# Patient Record
Sex: Male | Born: 1970
Health system: Southern US, Community
[De-identification: ages and names within clinical notes are randomized; demographics above are authoritative.]

## PROBLEM LIST (undated history)

## (undated) DIAGNOSIS — T7840XA Allergy, unspecified, initial encounter: Secondary | ICD-10-CM

## (undated) HISTORY — DX: Allergy, unspecified, initial encounter: T78.40XA

## (undated) HISTORY — PX: NO PAST SURGERIES: SHX2092

---

## 2016-02-03 ENCOUNTER — Ambulatory Visit (INDEPENDENT_AMBULATORY_CARE_PROVIDER_SITE_OTHER): Payer: Self-pay | Admitting: Family Medicine

## 2016-02-03 ENCOUNTER — Telehealth: Payer: Self-pay | Admitting: Emergency Medicine

## 2016-02-03 ENCOUNTER — Ambulatory Visit (INDEPENDENT_AMBULATORY_CARE_PROVIDER_SITE_OTHER): Payer: Self-pay

## 2016-02-03 ENCOUNTER — Other Ambulatory Visit: Payer: Self-pay | Admitting: Emergency Medicine

## 2016-02-03 VITALS — BP 124/82 | HR 83 | Temp 98.0°F | Resp 15 | Ht 70.75 in | Wt 178.6 lb

## 2016-02-03 DIAGNOSIS — R0989 Other specified symptoms and signs involving the circulatory and respiratory systems: Secondary | ICD-10-CM

## 2016-02-03 DIAGNOSIS — J019 Acute sinusitis, unspecified: Secondary | ICD-10-CM

## 2016-02-03 DIAGNOSIS — R0789 Other chest pain: Secondary | ICD-10-CM

## 2016-02-03 DIAGNOSIS — F458 Other somatoform disorders: Secondary | ICD-10-CM

## 2016-02-03 LAB — POCT CBC
GRANULOCYTE PERCENT: 63.2 % (ref 37–80)
HEMATOCRIT: 42.7 % — AB (ref 43.5–53.7)
HEMOGLOBIN: 15.1 g/dL (ref 14.1–18.1)
LYMPH, POC: 1.3 (ref 0.6–3.4)
MCH, POC: 29.2 pg (ref 27–31.2)
MCHC: 35.3 g/dL (ref 31.8–35.4)
MCV: 82.7 fL (ref 80–97)
MID (cbc): 0.2 (ref 0–0.9)
MPV: 7 fL (ref 0–99.8)
POC GRANULOCYTE: 2.6 (ref 2–6.9)
POC LYMPH PERCENT: 32.3 %L (ref 10–50)
POC MID %: 4.5 % (ref 0–12)
Platelet Count, POC: 230 10*3/uL (ref 142–424)
RBC: 5.17 M/uL (ref 4.69–6.13)
RDW, POC: 14.9 %
WBC: 4.1 10*3/uL — AB (ref 4.6–10.2)

## 2016-02-03 LAB — POCT RAPID STREP A (OFFICE): Rapid Strep A Screen: NEGATIVE

## 2016-02-03 MED ORDER — IPRATROPIUM BROMIDE 0.03 % NA SOLN: 2.0000 | Freq: Two times a day (BID) | NASAL | Status: DC

## 2016-02-03 MED ORDER — AMOXICILLIN-POT CLAVULANATE 875-125 MG PO TABS: 1.0000 | ORAL_TABLET | Freq: Two times a day (BID) | ORAL | Status: AC

## 2016-02-03 MED ORDER — AMOXICILLIN-POT CLAVULANATE 875-125 MG PO TABS: 1.0000 | ORAL_TABLET | Freq: Two times a day (BID) | ORAL | Status: DC

## 2016-02-03 NOTE — Progress Notes (Signed)
[facility] [address], [place] [address] [phone]  Date:  02/03/2016   Name:  Ronald Richardson   DOB:  March 21, 1971   MRN:  [id number]  PCP:  No PCP Per Patient    Chief Complaint: Sore Throat and Depression screening   History of Present Illness:  This is a 45 y.o. male who is presenting with spitting up bloody mucus x 2 months. States it feels like there is something stuck in his throat. Every 30 minutes or less he feels he needs to spit up mucus. Not really coughing. He is having nasal congestion and frontal headache. No facial pain. Denies fever, chills. Is having some night sweats. Does not pay attention to his weight, not sure if he has lost or gained any.  No SOB or wheezing. States he has lost his sense of smell. He is sneezing and blowing out blood.  No revent travel From [place]. Has been in the [place] for 12 years. Has not been back to [place] in several years.  Aggravating/alleviating factors: Hasn't tried anything History of asthma: no History of env allergies: no Tobacco use: no Wife has been sick with her asthma, but otherwise is ok. Drives a fork lift for work. Does not have kids.  Review of Systems:  Review of Systems See HPI  There are no active problems to display for this patient.   Prior to Admission medications   Not on File    Not on File  History reviewed. No pertinent past surgical history.  Social History  Substance Use Topics  . Smoking status: Never Smoker   . Smokeless tobacco: None  . Alcohol Use: None    History reviewed. No pertinent family history.  Medication list has been reviewed and updated.  Physical Examination:  Physical Exam  Constitutional: He is oriented to person, place, and time. He appears well-developed and well-nourished. No distress.  HENT:  Head: Normocephalic and atraumatic.  Right Ear: Hearing, tympanic membrane, external ear and ear canal normal.  Left Ear: Hearing, tympanic membrane,  external ear and ear canal normal.  Nose: Nose normal. Right sinus exhibits no maxillary sinus tenderness and no frontal sinus tenderness. Left sinus exhibits no maxillary sinus tenderness and no frontal sinus tenderness.  Mouth/Throat: Uvula is midline and mucous membranes are normal. Posterior oropharyngeal erythema present. No oropharyngeal exudate or posterior oropharyngeal edema.  In room patient spits up moderate amount of yellow/brown mucus into a tissue  Eyes: Conjunctivae and lids are normal. Right eye exhibits no discharge. Left eye exhibits no discharge. No scleral icterus.  Neck: Trachea normal. Carotid bruit is not present. No thyromegaly present.  Cardiovascular: Normal rate, regular rhythm, normal heart sounds and normal pulses.   No murmur heard. Pulmonary/Chest: Effort normal and breath sounds normal. No respiratory distress. He has no wheezes. He has no rhonchi. He has no rales.  Musculoskeletal: Normal range of motion.  Lymphadenopathy:       Head (right side): No submental, no submandibular and no tonsillar adenopathy present.       Head (left side): No submental, no submandibular and no tonsillar adenopathy present.    He has no cervical adenopathy.  Neurological: He is alert and oriented to person, place, and time.  Skin: Skin is warm, dry and intact. No lesion and no rash noted.  Psychiatric: He has a normal mood and affect. His speech is normal and behavior is normal. Thought content normal.   BP 124/82 mmHg  Pulse  83  Temp(Src) 98 F (36.7 C) (Oral)  Resp 15  Ht 5' 10.75" (1.797 m)  Wt 178 lb 9.6 oz (81.012 kg)  BMI 25.09 kg/m2  SpO2 99%  Results for orders placed or performed in visit on 02/03/16  POCT CBC  Result Value Ref Range   WBC 4.1 (A) 4.6 - 10.2 K/uL   Lymph, poc 1.3 0.6 - 3.4   POC LYMPH PERCENT 32.3 10 - 50 %L   MID (cbc) 0.2 0 - 0.9   POC MID % 4.5 0 - 12 %M   POC Granulocyte 2.6 2 - 6.9   Granulocyte percent 63.2 37 - 80 %G   RBC 5.17 4.69  - 6.13 M/uL   Hemoglobin 15.1 14.1 - 18.1 g/dL   HCT, POC 16.142.7 (A) 09.643.5 - 53.7 %   MCV 82.7 80 - 97 fL   MCH, POC 29.2 27 - 31.2 pg   MCHC 35.3 31.8 - 35.4 g/dL   RDW, POC 04.514.9 %   Platelet Count, POC 230 142 - 424 K/uL   MPV 7.0 0 - 99.8 fL  POCT rapid strep A  Result Value Ref Range   Rapid Strep A Screen Negative Negative   EKG: mild sinus brady, no signs of ischemia  Dg Chest 2 View  02/03/2016  CLINICAL DATA:  Hemoptysis for the past 2 months EXAM: CHEST  2 VIEW COMPARISON:  None in PACs FINDINGS: The lungs are well-expanded. There is no focal infiltrate. There is no pleural effusion. No pulmonary parenchymal nodules or masses are observed. The heart and pulmonary vascularity are normal. The mediastinum is normal in width. The trachea is midline. The bony thorax is unremarkable. IMPRESSION: There is no evidence of pneumonia nor other acute cardiopulmonary abnormality. If the patient's hemoptysis persists and sinus disease has been excluded, chest CT scanning would be a useful next imaging step. Electronically Signed   By: David  SwazilandJordan M.D.   On: 02/03/2016 11:18     Assessment and Plan:  1. Chest tightness 2. Globus sensation 3. Acute sinusitis CBC, EKG, CXR all wnl. Rapid strep negative. Suspect possible sinusitis as cause of mucus production. Will treat with augmentin and nasal spray. If symptoms not improving after abx, return for further eval. - EKG 12-Lead - DG Chest 2 View; Future - POCT CBC - POCT rapid strep A - Culture, Group A Strep - amoxicillin-clavulanate (AUGMENTIN) 875-125 MG tablet; Take 1 tablet by mouth 2 (two) times daily.  Dispense: 20 tablet; Refill: 0 - ipratropium (ATROVENT) 0.03 % nasal spray; Place 2 sprays into both nostrils 2 (two) times daily.  Dispense: 30 mL; Refill: 0   Nicole V. Dyke BrackettBush, PA-C, MHS [facility]  02/03/2016

## 2016-02-03 NOTE — Telephone Encounter (Signed)
Prescription sent to the wrong pharmacy. [facility] [place]

## 2016-02-03 NOTE — Patient Instructions (Addendum)
Take antibiotic twice a day for 10 days Use nasal spray, two sprays in each nostril twice a day If you are not better after finishing the antibiotic, return to clinic for further evaluation    IF you received an x-ray today, you will receive an invoice from [facility]. Please contact [facility] at [phone] with questions or concerns regarding your invoice.   IF you received labwork today, you will receive an invoice from [facility]. Please contact [facility] at [phone] with questions or concerns regarding your invoice.   Our billing staff will not be able to assist you with questions regarding bills from these companies.  You will be contacted with the lab results as soon as they are available. The fastest way to get your results is to activate your My Chart account. Instructions are located on the last page of this paperwork. If you have not heard from us regarding the results in 2 weeks, please contact this office.

## 2016-02-05 LAB — CULTURE, GROUP A STREP: Organism ID, Bacteria: NORMAL

## 2016-03-14 NOTE — Progress Notes (Signed)
History and physical examinations reviewed in detail with PA Bush.  EKG reviewed during visit; CXR reviewed as well.  Agree with assessment and plan. Kristi Paulita FujitaMartin Smith, M.D. [facility]  [place] [address] [place], [place]  [id number] [phone] phone [phone] fax

## 2017-07-31 ENCOUNTER — Ambulatory Visit: Payer: Self-pay | Attending: Nurse Practitioner | Admitting: Nurse Practitioner

## 2017-07-31 ENCOUNTER — Encounter: Payer: Self-pay | Admitting: Nurse Practitioner

## 2017-07-31 VITALS — BP 112/69 | HR 85 | Temp 98.4°F | Resp 18 | Ht 70.0 in | Wt 186.6 lb

## 2017-07-31 DIAGNOSIS — J309 Allergic rhinitis, unspecified: Secondary | ICD-10-CM | POA: Insufficient documentation

## 2017-07-31 DIAGNOSIS — Z0000 Encounter for general adult medical examination without abnormal findings: Secondary | ICD-10-CM

## 2017-07-31 DIAGNOSIS — J3089 Other allergic rhinitis: Secondary | ICD-10-CM | POA: Insufficient documentation

## 2017-07-31 MED ORDER — LORATADINE 10 MG PO TABS: 10.0000 mg | ORAL_TABLET | Freq: Every day | ORAL | 11 refills | Status: DC

## 2017-07-31 MED ORDER — IPRATROPIUM BROMIDE 0.03 % NA SOLN: 2.0000 | Freq: Two times a day (BID) | NASAL | 2 refills | Status: DC

## 2017-07-31 MED ORDER — FLUTICASONE PROPIONATE 50 MCG/ACT NA SUSP: 1.0000 | Freq: Every day | NASAL | 6 refills | Status: DC

## 2017-07-31 MED FILL — FLUTICASONE PROP 50 MCG SPR: 50 | 30 days supply | Qty: 16 | Fill #0

## 2017-07-31 MED FILL — IPRATROPIUM 0.03% SPRAY: 0.03 | 15 days supply | Qty: 30 | Fill #0

## 2017-07-31 NOTE — Progress Notes (Signed)
Assessment & Plan:  Ronald LewandowskySeydou was seen today for new patient (initial visit).  Diagnoses and all orders for this visit:  Non-seasonal allergic rhinitis due to other allergic trigger -     loratadine (CLARITIN) 10 MG tablet; Take 1 tablet (10 mg total) daily by mouth. -     ipratropium (ATROVENT) 0.03 % nasal spray; Place 2 sprays 2 (two) times daily into both nostrils.  Routine adult health maintenance -     CBC -     Lipid panel -     Basic Metabolic Panel     Subjective:   Chief Complaint  Patient presents with  . New Patient (Initial Visit)    Patient would like to get nasal spray for his nose. Patient stated that his is stuffy and heavy.    HPI Ronald Richardson 46 y.o. male presents to office today with concerns of burning, itching and redness of eyes along with nasal stuffiness. He denies ever being diagnosed with allergies. He denies any injury to eyes or any visual disturbances.   Allergic Rhinitis: Ronald Richardson Richardson is here for evaluation of possible allergic rhinitis. Patient's symptoms include itchy eyes, itchy nose, nasal congestion, postnasal drip, sinus pressure and watery eyes. These symptoms are perennial. Current triggers include exposure to no known precipitant. The patient has been suffering from these symptoms for approximately several  years. The patient has tried nothing with no relief of symptoms. Immunotherapy has never been tried. The patient has never had nasal polyps. The patient has no history of asthma. The patient does not suffer from frequent sinopulmonary infections. The patient has not had sinus surgery in the past. The patient has no history of eczema.   History reviewed. No pertinent past medical history.  History reviewed. No pertinent surgical history.  History reviewed. No pertinent family history.  Social History   Socioeconomic History  . Marital status: Married    Spouse name: Not on file  . Number of children: Not on file  . Years of education:  Not on file  . Highest education level: Not on file  Social Needs  . Financial resource strain: Not on file  . Food insecurity - worry: Not on file  . Food insecurity - inability: Not on file  . Transportation needs - medical: Not on file  . Transportation needs - non-medical: Not on file  Occupational History  . Not on file  Tobacco Use  . Smoking status: Never Smoker  . Smokeless tobacco: Never Used  Substance and Sexual Activity  . Alcohol use: No    Alcohol/week: 0.0 oz    Frequency: Never  . Drug use: No  . Sexual activity: Yes  Other Topics Concern  . Not on file  Social History Narrative  . Not on file     Review of Systems  Constitutional: Negative for fever, malaise/fatigue and weight loss.  HENT: Positive for congestion. Negative for ear discharge, ear pain, hearing loss, nosebleeds, sinus pain, sore throat and tinnitus.   Eyes: Positive for pain and redness. Negative for blurred vision, double vision and photophobia.       Itchy watery eyes  Respiratory: Negative.  Negative for cough, shortness of breath and stridor.   Cardiovascular: Negative.  Negative for chest pain, palpitations and leg swelling.  Gastrointestinal: Positive for heartburn. Negative for abdominal pain, constipation, diarrhea, nausea and vomiting.  Musculoskeletal: Negative.  Negative for myalgias.  Neurological: Negative.  Negative for dizziness, focal weakness, seizures and headaches.  Endo/Heme/Allergies: Negative for  environmental allergies.  Psychiatric/Behavioral: Negative.  Negative for suicidal ideas.       Objective:    Physical Exam  Constitutional: He is oriented to person, place, and time. He appears well-developed and well-nourished. He is cooperative.  HENT:  Head: Normocephalic and atraumatic.  Right Ear: Hearing, tympanic membrane, external ear and ear canal normal.  Left Ear: Hearing, tympanic membrane, external ear and ear canal normal.  Nose: Mucosal edema and rhinorrhea  present.  Mouth/Throat: Posterior oropharyngeal erythema present.  Eyes: EOM are normal.  Neck: Normal range of motion.  Cardiovascular: Normal rate, regular rhythm, normal heart sounds and intact distal pulses. Exam reveals no gallop and no friction rub.  No murmur heard. Pulmonary/Chest: Effort normal and breath sounds normal. No tachypnea. No respiratory distress. He has no decreased breath sounds. He has no wheezes. He has no rhonchi. He has no rales. He exhibits no tenderness.  Abdominal: Soft. Bowel sounds are normal.  Musculoskeletal: Normal range of motion. He exhibits no edema.  Neurological: He is alert and oriented to person, place, and time. Coordination normal.  Skin: Skin is warm and dry.  Psychiatric: He has a normal mood and affect. His behavior is normal. Judgment and thought content normal.  Nursing note and vitals reviewed.   BP 112/69 (BP Location: Left Arm, Patient Position: Sitting, Cuff Size: Normal)   Pulse 85   Temp 98.4 F (36.9 C) (Oral)   Resp 18   Ht 5\' 10"  (1.778 m)   Wt 186 lb 9.6 oz (84.6 kg)   SpO2 97%   BMI 26.77 kg/m  Wt Readings from Last 3 Encounters:  07/31/17 186 lb 9.6 oz (84.6 kg)  02/03/16 178 lb 9.6 oz (81 kg)   Patient has been counseled on age-appropriate routine health concerns for screening and prevention. These are reviewed and up-to-date. Referrals have been placed accordingly. Immunizations are up-to-date or declined.        Patient has been counseled extensively about nutrition and exercise as well as the importance of adherence with medications and regular follow-up. The patient was given clear instructions to go to ER or return to medical center if symptoms don't improve, worsen or new problems develop. The patient verbalized understanding.  Patient has been counseled on age-appropriate routine health concerns for screening and prevention. These are reviewed and up-to-date. Referrals have been placed accordingly. Immunizations are  up-to-date or declined.      Follow-up: Return in about 6 weeks (around 09/11/2017) for Follow up with me; Needs appointment with financial representative.Claiborne Rigg.   Zelda W Fleming, FNP-BC [facility] [place], [place] [phone]   07/31/2017, 12:34 PM

## 2017-07-31 NOTE — Patient Instructions (Addendum)
Allergies An allergy is when your body reacts to a substance in a way that is not normal. An allergic reaction can happen after you:  Eat something.  Breathe in something.  Touch something.  You can be allergic to:  Things that are only around during certain seasons, like molds and pollens.  Foods.  Drugs.  Insects.  Animal dander.  What are the signs or symptoms?  Puffiness (swelling). This may happen on the lips, face, tongue, mouth, or throat.  Sneezing.  Coughing.  Breathing loudly (wheezing).  Stuffy nose.  Tingling in the mouth.  A rash.  Itching.  Itchy, red, puffy areas of skin (hives).  Watery eyes.  Throwing up (vomiting).  Watery poop (diarrhea).  Dizziness.  Feeling faint or fainting.  Trouble breathing or swallowing.  A tight feeling in the chest.  A fast heartbeat. How is this diagnosed? Allergies can be diagnosed with:  A medical and family history.  Skin tests.  Blood tests.  A food diary. A food diary is a record of all the foods, drinks, and symptoms you have each day.  The results of an elimination diet. This diet involves making sure not to eat certain foods and then seeing what happens when you start eating them again.  How is this treated? There is no cure for allergies, but allergic reactions can be treated with medicine. Severe reactions usually need to be treated at a hospital. How is this prevented? The best way to prevent an allergic reaction is to avoid the thing you are allergic to. Allergy shots and medicines can also help prevent reactions in some cases. This information is not intended to replace advice given to you by your health care provider. Make sure you discuss any questions you have with your health care provider. Document Released: 12/30/2012 Document Revised: 05/01/2016 Document Reviewed: 06/16/2014 Elsevier Interactive Patient Education  2018 [employer].  Nasal Allergies Nasal allergies are a  reaction to allergens in the air. Allergens are tiny specks (particles) in the air that cause your body to have an allergic reaction. Nasal allergies are not passed from person to person (contagious). They cannot be cured, but they can be controlled. Common causes of nasal allergies include:  Pollen from grasses, trees, and weeds.  House dust mites.  Pet dander.  Mold.  Follow these instructions at home:  Avoid the allergen that is causing your symptoms, if you can.  Keep windows closed. If possible, use air conditioning when there is a lot of pollen in the air.  Do not use fans in your home.  Do not hang clothes outside to dry.  Wear sunglasses to keep pollen out of your eyes.  Wash your hands right away after you touch household pets.  Take over-the-counter and prescription medicines only as told by your doctor.  Keep all follow-up visits as told by your doctor. This is important. Contact a doctor if:  You have a fever.  You have a cough that does not go away (is persistent).  You start to make whistling sounds when you breathe (wheeze).  Your symptoms do not get better with treatment.  You have thick fluid coming from your nose.  You start to have nosebleeds. Get help right away if:  Your tongue or your lips are swollen.  You have trouble breathing.  You feel light-headed or you feel like you are going to pass out (faint).  You have cold sweats. This information is not intended to replace advice given to  you by your health care provider. Make sure you discuss any questions you have with your health care provider. Document Released: 01/04/2011 Document Revised: 02/10/2016 Document Reviewed: 03/17/2015 Elsevier Interactive Patient Education  [employer]2018 Elsevier Inc.

## 2017-08-01 LAB — LIPID PANEL
Chol/HDL Ratio: 4.1 ratio (ref 0.0–5.0)
Cholesterol, Total: 165 mg/dL (ref 100–199)
HDL: 40 mg/dL (ref >39)
LDL Calculated: 111 mg/dL — ABNORMAL HIGH (ref 0–99)
Triglycerides: 69 mg/dL (ref 0–149)
VLDL Cholesterol Cal: 14 mg/dL (ref 5–40)

## 2017-08-01 LAB — CBC
HEMOGLOBIN: 13.5 g/dL (ref 13.0–17.7)
Hematocrit: 39.1 % (ref 37.5–51.0)
MCH: 27.6 pg (ref 26.6–33.0)
MCHC: 34.5 g/dL (ref 31.5–35.7)
MCV: 80 fL (ref 79–97)
PLATELETS: 251 10*3/uL (ref 150–379)
RBC: 4.9 x10E6/uL (ref 4.14–5.80)
RDW: 15.3 % (ref 12.3–15.4)
WBC: 5 10*3/uL (ref 3.4–10.8)

## 2017-08-01 LAB — BASIC METABOLIC PANEL
BUN / CREAT RATIO: 16 (ref 9–20)
BUN: 18 mg/dL (ref 6–24)
CALCIUM: 9.4 mg/dL (ref 8.7–10.2)
CO2: 22 mmol/L (ref 20–29)
CREATININE: 1.1 mg/dL (ref 0.76–1.27)
Chloride: 102 mmol/L (ref 96–106)
GFR calc Af Amer: 93 mL/min/{1.73_m2} (ref >59)
GFR calc non Af Amer: 80 mL/min/{1.73_m2} (ref >59)
GLUCOSE: 102 mg/dL — AB (ref 65–99)
Potassium: 4.3 mmol/L (ref 3.5–5.2)
Sodium: 142 mmol/L (ref 134–144)

## 2017-08-02 ENCOUNTER — Telehealth: Payer: Self-pay

## 2017-08-02 NOTE — Telephone Encounter (Signed)
-----   Message from Zelda W Fleming, NP sent at 08/02/2017 10:08 AM EST ----- Please inform patient that laboratory results are essentially normal. LDL is elevated in regards to lipids. We will continue to monitor for any trending on future lab results. Continue healthy eating habit and regular physical exercise at least 3 times a week, 50 minutes each time. 

## 2017-08-02 NOTE — Telephone Encounter (Signed)
-----   Message from Claiborne RiggZelda W Fleming, NP sent at 08/02/2017 10:08 AM EST ----- Please inform patient that laboratory results are essentially normal. LDL is elevated in regards to lipids. We will continue to monitor for any trending on future lab results. Continue healthy eating habit and regular physical exercise at least 3 times a week, 50 minutes each time.

## 2017-08-02 NOTE — Telephone Encounter (Signed)
Pt. Returned nurse call regarding lab results. Please f/u with pt.

## 2017-08-02 NOTE — Telephone Encounter (Signed)
Patient did not pick up. Left a message for patient to call back.

## 2017-08-02 NOTE — Telephone Encounter (Signed)
Patient did not answer. Left a message for patient to call back.

## 2017-08-03 NOTE — Telephone Encounter (Signed)
-----   Message from Zelda W Fleming, NP sent at 08/02/2017 10:08 AM EST ----- Please inform patient that laboratory results are essentially normal. LDL is elevated in regards to lipids. We will continue to monitor for any trending on future lab results. Continue healthy eating habit and regular physical exercise at least 3 times a week, 50 minutes each time. 

## 2017-08-03 NOTE — Telephone Encounter (Signed)
Patient informed on lab result and to eat healthy diet, exercise a well.  Patient verified DOB.

## 2017-09-12 ENCOUNTER — Ambulatory Visit: Payer: Self-pay | Admitting: Nurse Practitioner

## 2017-12-07 IMAGING — CR DG CHEST 2V
2 series · 2 of 2 positions shown · non-contrast
Comparison: None in PACs

CLINICAL DATA: Hemoptysis for the past 2 months

EXAM:
CHEST  2 VIEW

[PA]
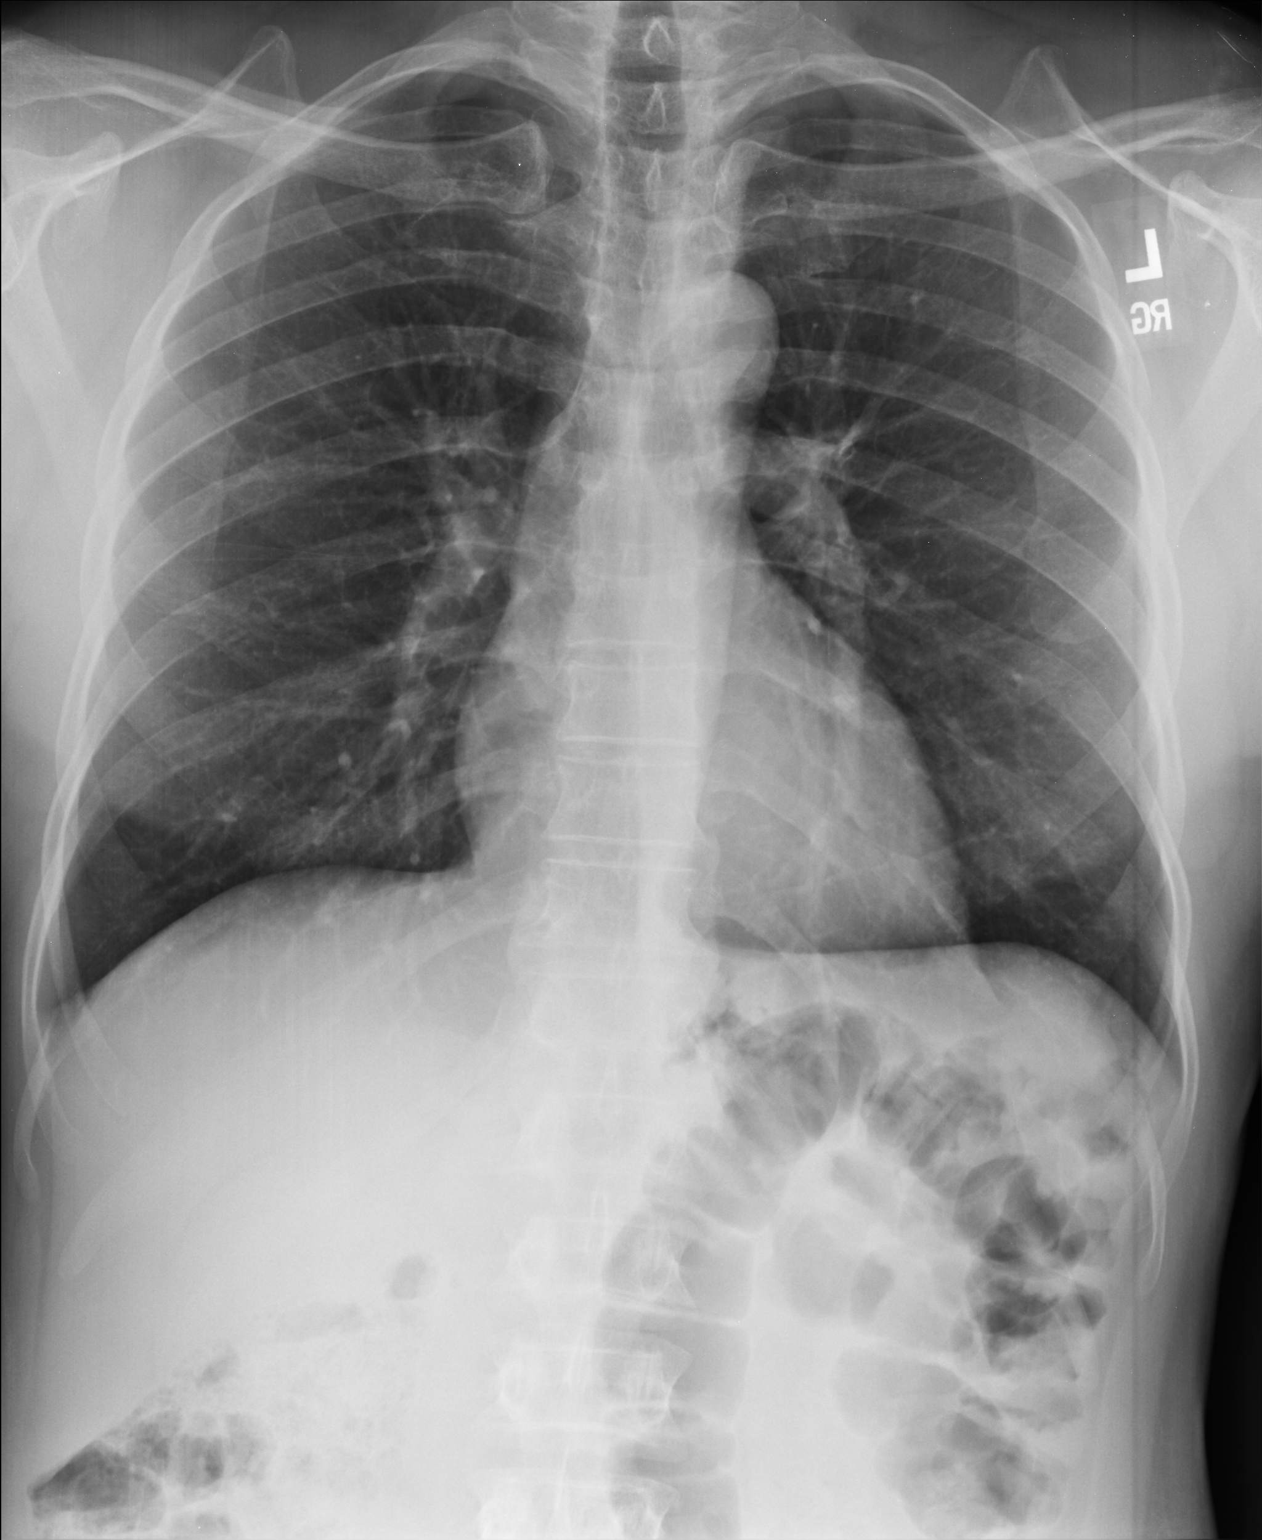

[lateral]
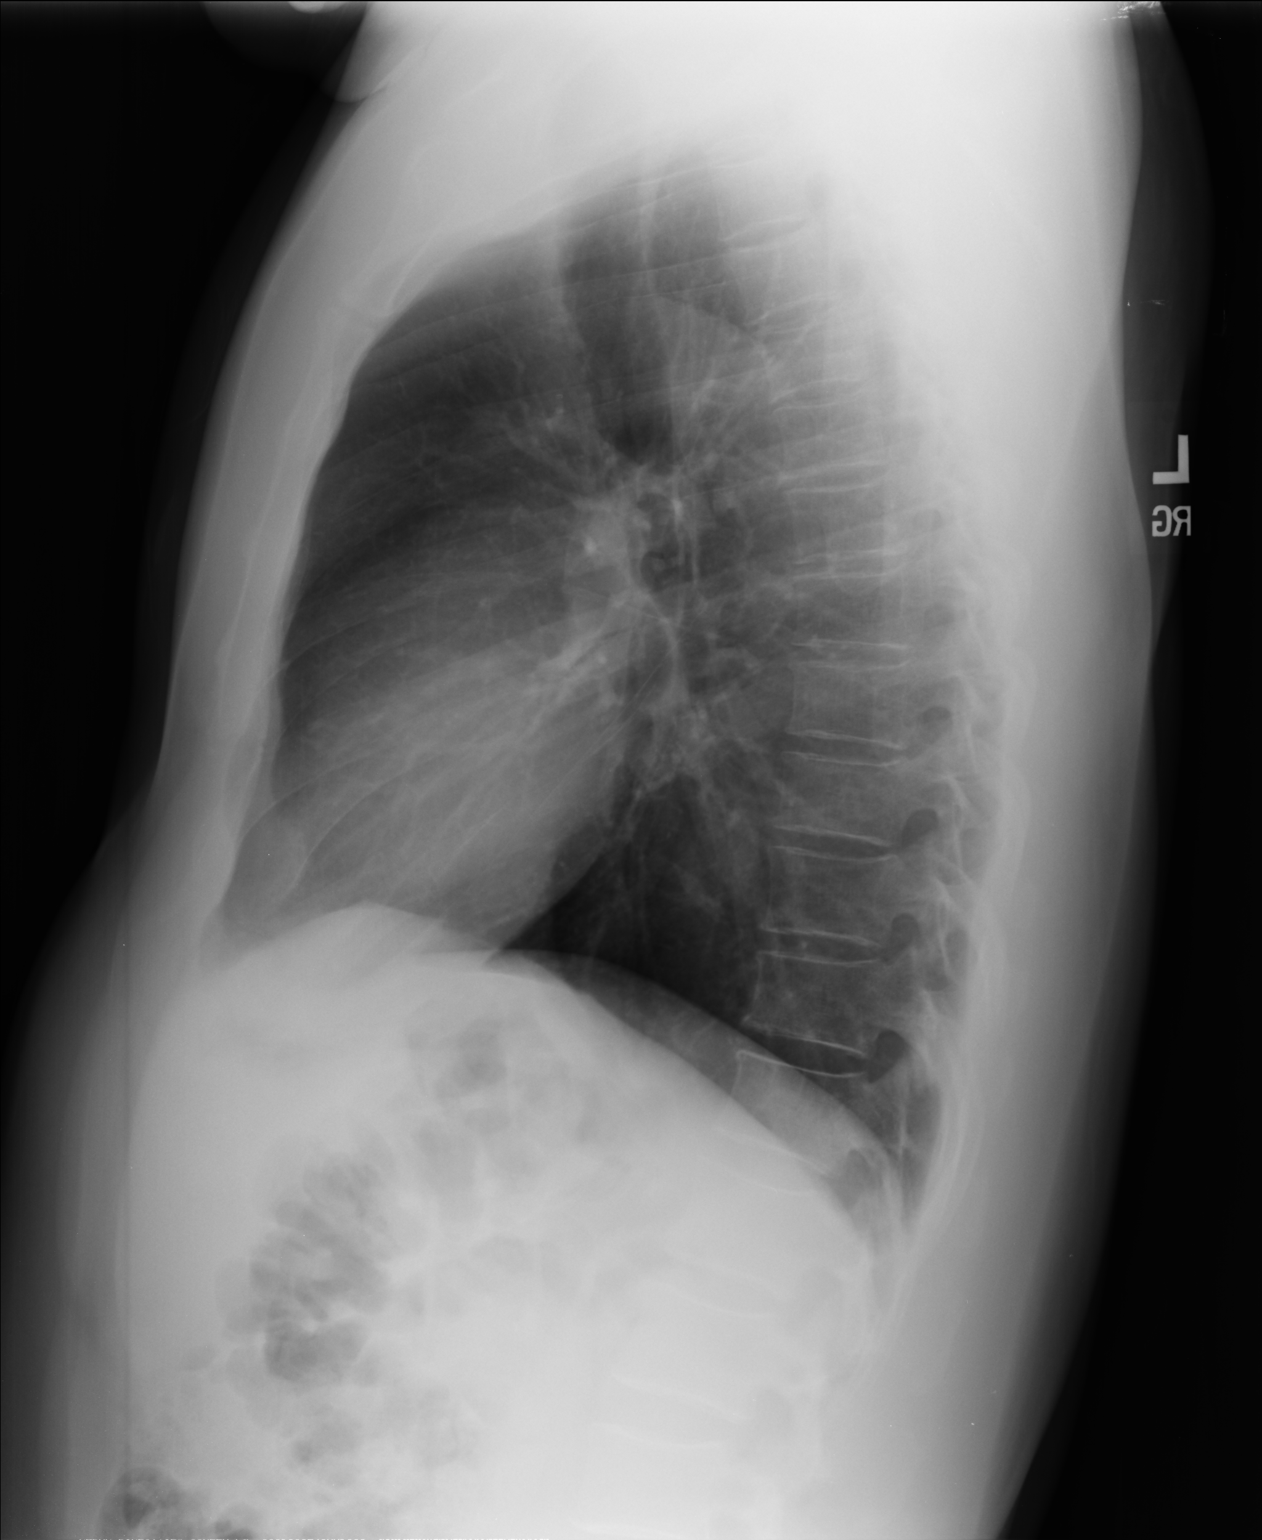

[2 of 2 positions shown; findings below may reference images not displayed]

FINDINGS: The lungs are well-expanded. There is no focal infiltrate. There is
no pleural effusion. No pulmonary parenchymal nodules or masses are
observed. The heart and pulmonary vascularity are normal. The
mediastinum is normal in width. The trachea is midline. The bony
thorax is unremarkable.
IMPRESSION: There is no evidence of pneumonia nor other acute cardiopulmonary
abnormality. If the patient's hemoptysis persists and sinus disease
has been excluded, chest CT scanning would be a useful next imaging
step.

## 2018-02-07 ENCOUNTER — Encounter: Payer: Self-pay | Admitting: Family Medicine

## 2019-11-10 ENCOUNTER — Ambulatory Visit: Payer: Self-pay | Attending: Nurse Practitioner | Admitting: Nurse Practitioner

## 2019-11-10 ENCOUNTER — Other Ambulatory Visit: Payer: Self-pay

## 2019-11-10 ENCOUNTER — Encounter: Payer: Self-pay | Admitting: Nurse Practitioner

## 2019-11-10 VITALS — Ht 71.0 in | Wt 199.0 lb

## 2019-11-10 DIAGNOSIS — Z114 Encounter for screening for human immunodeficiency virus [HIV]: Secondary | ICD-10-CM

## 2019-11-10 DIAGNOSIS — D72819 Decreased white blood cell count, unspecified: Secondary | ICD-10-CM

## 2019-11-10 DIAGNOSIS — J3089 Other allergic rhinitis: Secondary | ICD-10-CM

## 2019-11-10 DIAGNOSIS — Z13228 Encounter for screening for other metabolic disorders: Secondary | ICD-10-CM

## 2019-11-10 DIAGNOSIS — R7309 Other abnormal glucose: Secondary | ICD-10-CM

## 2019-11-10 DIAGNOSIS — E785 Hyperlipidemia, unspecified: Secondary | ICD-10-CM

## 2019-11-10 DIAGNOSIS — Z7689 Persons encountering health services in other specified circumstances: Secondary | ICD-10-CM

## 2019-11-10 MED ORDER — IPRATROPIUM BROMIDE 0.03 % NA SOLN: 2.0000 | Freq: Two times a day (BID) | NASAL | 2 refills | Status: DC

## 2019-11-10 MED FILL — IPRATROPIUM 0.03% SPRAY: 0.03 | 30 days supply | Qty: 30 | Fill #0

## 2019-11-10 NOTE — Progress Notes (Signed)
Virtual Visit via Telephone Note Due to national recommendations of social distancing due to COVID 19, telehealth visit is felt to be most appropriate for this patient at this time.  I discussed the limitations, risks, security and privacy concerns of performing an evaluation and management service by telephone and the availability of in person appointments. I also discussed with the patient that there may be a patient responsible charge related to this service. The patient expressed understanding and agreed to proceed.    I connected with Ronald Richardson on 11/10/19  at   8:50 AM EST  EDT by telephone and verified that I am speaking with the correct person using two identifiers.   Consent I discussed the limitations, risks, security and privacy concerns of performing an evaluation and management service by telephone and the availability of in person appointments. I also discussed with the patient that there may be a patient responsible charge related to this service. The patient expressed understanding and agreed to proceed.   Location of Patient: Private  Residence   Location of Provider: [facility]    Persons participating in Telemedicine visit: Zelda Fleming FNP-BC YY Bien CMA Bijan Ridolfi    History of Present Illness: Telemedicine visit for: Re establish Care  has a past medical history of Allergy.    Allergy symptoms include nasal congestion, rhinitis. He experiences headaches with flonase and requesting atrovent today.    Past Medical History:  Diagnosis Date  . Allergy     Past Surgical History:  Procedure Laterality Date  . NO PAST SURGERIES      Family History  Problem Relation Age of Onset  . Hypertension Neg Hx   . Diabetes Neg Hx     Social History   Socioeconomic History  . Marital status: Married    Spouse name: Not on file  . Number of children: Not on file  . Years of education: Not on file  . Highest education level: Not  on file  Occupational History  . Not on file  Tobacco Use  . Smoking status: Never Smoker  . Smokeless tobacco: Never Used  Substance and Sexual Activity  . Alcohol use: No    Alcohol/week: 0.0 standard drinks  . Drug use: No  . Sexual activity: Yes  Other Topics Concern  . Not on file  Social History Narrative  . Not on file   Social Determinants of Health   Financial Resource Strain:   . Difficulty of Paying Living Expenses: Not on file  Food Insecurity:   . Worried About Running Out of Food in the Last Year: Not on file  . Ran Out of Food in the Last Year: Not on file  Transportation Needs:   . Lack of Transportation (Medical): Not on file  . Lack of Transportation (Non-Medical): Not on file  Physical Activity:   . Days of Exercise per Week: Not on file  . Minutes of Exercise per Session: Not on file  Stress:   . Feeling of Stress : Not on file  Social Connections:   . Frequency of Communication with Friends and Family: Not on file  . Frequency of Social Gatherings with Friends and Family: Not on file  . Attends Religious Services: Not on file  . Active Member of Clubs or Organizations: Not on file  . Attends Club or Organization Meetings: Not on file  . Marital Status: Not on file     Observations/Objective: Awake, alert and oriented x 3     Review of Systems  Constitutional: Negative for fever, malaise/fatigue and weight loss.  HENT: Negative for congestion, ear discharge, ear pain, hearing loss, nosebleeds, sinus pain, sore throat and tinnitus.        Nasal congestion  Eyes: Negative.  Negative for blurred vision, double vision and photophobia.  Respiratory: Negative.  Negative for cough, shortness of breath and stridor.   Cardiovascular: Negative.  Negative for chest pain, palpitations and leg swelling.  Gastrointestinal: Negative.  Negative for heartburn, nausea and vomiting.  Musculoskeletal: Negative.  Negative for myalgias.  Neurological: Negative.   Negative for dizziness, focal weakness, seizures and headaches.  Endo/Heme/Allergies: Positive for environmental allergies.  Psychiatric/Behavioral: Negative.  Negative for suicidal ideas.    Assessment and Plan: Ronald Richardson was seen today for establish care.  Diagnoses and all orders for this visit:  Encounter to establish care  Non-seasonal allergic rhinitis due to other allergic trigger -     ipratropium (ATROVENT) 0.03 % nasal spray; Place 2 sprays into both nostrils 2 (two) times daily.  Dyslipidemia -     Lipid panel; Future  Elevated glucose -     Hemoglobin A1c; Future -     CMP14+EGFR; Future  Screening for metabolic disorder -     CMP14+EGFR; Future  Leukopenia, unspecified type -     CBC; Future  Encounter for screening for HIV -     HIV antibody (with reflex); Future     Follow Up Instructions No follow-ups on file.     I discussed the assessment and treatment plan with the patient. The patient was provided an opportunity to ask questions and all were answered. The patient agreed with the plan and demonstrated an understanding of the instructions.   The patient was advised to call back or seek an in-person evaluation if the symptoms worsen or if the condition fails to improve as anticipated.  I provided 14 minutes of non-face-to-face time during this encounter including median intraservice time, reviewing previous notes, labs, imaging, medications and explaining diagnosis and management.  Zelda W Fleming, FNP-BC 

## 2019-11-12 ENCOUNTER — Ambulatory Visit: Payer: PRIVATE HEALTH INSURANCE | Attending: Nurse Practitioner

## 2019-11-12 ENCOUNTER — Other Ambulatory Visit: Payer: Self-pay

## 2019-11-12 DIAGNOSIS — E785 Hyperlipidemia, unspecified: Secondary | ICD-10-CM

## 2019-11-12 DIAGNOSIS — R7309 Other abnormal glucose: Secondary | ICD-10-CM

## 2019-11-12 DIAGNOSIS — D72819 Decreased white blood cell count, unspecified: Secondary | ICD-10-CM

## 2019-11-12 DIAGNOSIS — Z114 Encounter for screening for human immunodeficiency virus [HIV]: Secondary | ICD-10-CM

## 2019-11-12 DIAGNOSIS — Z13228 Encounter for screening for other metabolic disorders: Secondary | ICD-10-CM

## 2019-11-13 LAB — LIPID PANEL
Chol/HDL Ratio: 3.9 ratio (ref 0.0–5.0)
Cholesterol, Total: 146 mg/dL (ref 100–199)
HDL: 37 mg/dL — ABNORMAL LOW (ref >39)
LDL Chol Calc (NIH): 99 mg/dL (ref 0–99)
Triglycerides: 44 mg/dL (ref 0–149)
VLDL Cholesterol Cal: 10 mg/dL (ref 5–40)

## 2019-11-13 LAB — CMP14+EGFR
ALT: 17 IU/L (ref 0–44)
AST: 22 IU/L (ref 0–40)
Albumin/Globulin Ratio: 1.8 (ref 1.2–2.2)
Albumin: 4.4 g/dL (ref 4.0–5.0)
Alkaline Phosphatase: 65 IU/L (ref 39–117)
BUN/Creatinine Ratio: 15 (ref 9–20)
BUN: 16 mg/dL (ref 6–24)
Bilirubin Total: 0.5 mg/dL (ref 0.0–1.2)
CO2: 23 mmol/L (ref 20–29)
Calcium: 9.4 mg/dL (ref 8.7–10.2)
Chloride: 103 mmol/L (ref 96–106)
Creatinine, Ser: 1.1 mg/dL (ref 0.76–1.27)
GFR calc Af Amer: 91 mL/min/{1.73_m2} (ref >59)
GFR calc non Af Amer: 79 mL/min/{1.73_m2} (ref >59)
Globulin, Total: 2.5 g/dL (ref 1.5–4.5)
Glucose: 94 mg/dL (ref 65–99)
Potassium: 4.5 mmol/L (ref 3.5–5.2)
Sodium: 141 mmol/L (ref 134–144)
Total Protein: 6.9 g/dL (ref 6.0–8.5)

## 2019-11-13 LAB — CBC
Hematocrit: 42.7 % (ref 37.5–51.0)
Hemoglobin: 14.4 g/dL (ref 13.0–17.7)
MCH: 28.5 pg (ref 26.6–33.0)
MCHC: 33.7 g/dL (ref 31.5–35.7)
MCV: 84 fL (ref 79–97)
Platelets: 260 10*3/uL (ref 150–450)
RBC: 5.06 x10E6/uL (ref 4.14–5.80)
RDW: 13.9 % (ref 11.6–15.4)
WBC: 3.9 10*3/uL (ref 3.4–10.8)

## 2019-11-13 LAB — HEMOGLOBIN A1C
Est. average glucose Bld gHb Est-mCnc: 120 mg/dL
Hgb A1c MFr Bld: 5.8 % — ABNORMAL HIGH (ref 4.8–5.6)

## 2019-11-13 LAB — HIV ANTIBODY (ROUTINE TESTING W REFLEX): HIV Screen 4th Generation wRfx: NONREACTIVE

## 2020-01-30 ENCOUNTER — Ambulatory Visit: Payer: PRIVATE HEALTH INSURANCE | Admitting: Nurse Practitioner

## 2020-02-09 ENCOUNTER — Telehealth: Payer: Self-pay

## 2020-02-11 NOTE — Telephone Encounter (Signed)
Confirmed meds for refill.

## 2020-03-10 ENCOUNTER — Other Ambulatory Visit: Payer: Self-pay | Admitting: Nurse Practitioner

## 2020-03-10 ENCOUNTER — Other Ambulatory Visit: Payer: Self-pay

## 2020-03-10 ENCOUNTER — Ambulatory Visit: Payer: PRIVATE HEALTH INSURANCE | Attending: Nurse Practitioner | Admitting: Nurse Practitioner

## 2020-03-10 DIAGNOSIS — R7303 Prediabetes: Secondary | ICD-10-CM

## 2020-03-10 DIAGNOSIS — J3089 Other allergic rhinitis: Secondary | ICD-10-CM

## 2020-03-10 MED ORDER — AZELASTINE HCL 0.1 % NA SOLN: 2.0000 | Freq: Two times a day (BID) | NASAL | 12 refills | Status: DC

## 2020-03-10 MED ORDER — LORATADINE 10 MG PO TABS: 10.0000 mg | ORAL_TABLET | Freq: Every day | ORAL | 11 refills | Status: DC

## 2020-03-10 MED FILL — AZELASTINE HCL 137 MCG SPRY: 0.1 | 30 days supply | Qty: 30 | Fill #0

## 2020-03-15 ENCOUNTER — Ambulatory Visit: Payer: PRIVATE HEALTH INSURANCE | Attending: Nurse Practitioner

## 2020-03-15 ENCOUNTER — Other Ambulatory Visit: Payer: Self-pay

## 2020-03-15 DIAGNOSIS — R7303 Prediabetes: Secondary | ICD-10-CM

## 2020-03-16 LAB — CMP14+EGFR
ALT: 13 IU/L (ref 0–44)
AST: 20 IU/L (ref 0–40)
Albumin/Globulin Ratio: 2.1 (ref 1.2–2.2)
Albumin: 4.6 g/dL (ref 4.0–5.0)
Alkaline Phosphatase: 69 IU/L (ref 48–121)
BUN/Creatinine Ratio: 17 (ref 9–20)
BUN: 17 mg/dL (ref 6–24)
Bilirubin Total: 0.4 mg/dL (ref 0.0–1.2)
CO2: 25 mmol/L (ref 20–29)
Calcium: 9.8 mg/dL (ref 8.7–10.2)
Chloride: 103 mmol/L (ref 96–106)
Creatinine, Ser: 1 mg/dL (ref 0.76–1.27)
GFR calc Af Amer: 102 mL/min/{1.73_m2} (ref >59)
GFR calc non Af Amer: 88 mL/min/{1.73_m2} (ref >59)
Globulin, Total: 2.2 g/dL (ref 1.5–4.5)
Glucose: 83 mg/dL (ref 65–99)
Potassium: 4.5 mmol/L (ref 3.5–5.2)
Sodium: 140 mmol/L (ref 134–144)
Total Protein: 6.8 g/dL (ref 6.0–8.5)

## 2020-03-16 LAB — HEMOGLOBIN A1C
Est. average glucose Bld gHb Est-mCnc: 111 mg/dL
Hgb A1c MFr Bld: 5.5 % (ref 4.8–5.6)

## 2020-03-18 ENCOUNTER — Telehealth: Payer: Self-pay | Admitting: *Deleted

## 2020-03-18 NOTE — Telephone Encounter (Signed)
Chart opened in error

## 2020-03-18 NOTE — Telephone Encounter (Addendum)
Pt returned call and was given the lab result message from [facility] regarding his diabetes has improved.

## 2020-03-30 ENCOUNTER — Other Ambulatory Visit: Payer: Self-pay | Admitting: Nurse Practitioner

## 2020-03-30 MED ORDER — TERBINAFINE HCL 250 MG PO TABS: 250.0000 mg | ORAL_TABLET | Freq: Every day | ORAL | 0 refills | Status: DC

## 2020-03-31 MED FILL — TERBINAFINE HCL 250 MG TABS: 250 | 42 days supply | Qty: 42 | Fill #0

## 2020-05-28 ENCOUNTER — Telehealth: Payer: Self-pay | Admitting: Nurse Practitioner

## 2020-05-28 NOTE — Telephone Encounter (Signed)
Copied from CRM [id number]. Topic: General - Other >> May 28, 2020  4:25 PM Tamela Oddi wrote: Reason for CRM: Patient called to get a stronger sinus medication.  Patient stated that this medication is not helping him.  Please advise and call patient to get something stronger.  CB# [phone]

## 2020-05-30 NOTE — Telephone Encounter (Signed)
Needs office visit. Can try sudafed over the counter

## 2020-05-31 NOTE — Telephone Encounter (Signed)
Spoke to patient and attempt to schedule to see one of the provider at [facility]. No availability, CMA sent patient to the Mobile Bus location for Wednesday 06/02/2020. Pt. Is aware the bus open from 9-7pm.

## 2020-06-22 ENCOUNTER — Telehealth: Payer: PRIVATE HEALTH INSURANCE | Admitting: Nurse Practitioner

## 2020-08-02 ENCOUNTER — Telehealth: Payer: PRIVATE HEALTH INSURANCE | Admitting: Nurse Practitioner

## 2020-09-18 ENCOUNTER — Emergency Department (HOSPITAL_COMMUNITY)
Admission: EM | Admit: 2020-09-18 | Discharge: 2020-09-18 | Disposition: A | Discharge status: Home / Self Care | Payer: PRIVATE HEALTH INSURANCE | Attending: Emergency Medicine | Admitting: Emergency Medicine

## 2020-09-18 ENCOUNTER — Other Ambulatory Visit (HOSPITAL_COMMUNITY): Payer: Self-pay | Admitting: Emergency Medicine

## 2020-09-18 ENCOUNTER — Encounter (HOSPITAL_COMMUNITY): Payer: Self-pay | Admitting: Emergency Medicine

## 2020-09-18 DIAGNOSIS — R059 Cough, unspecified: Secondary | ICD-10-CM | POA: Diagnosis present

## 2020-09-18 DIAGNOSIS — J3089 Other allergic rhinitis: Secondary | ICD-10-CM

## 2020-09-18 DIAGNOSIS — U071 COVID-19: Secondary | ICD-10-CM | POA: Diagnosis not present

## 2020-09-18 LAB — RESP PANEL BY RT-PCR (FLU A&B, COVID) ARPGX2
Influenza A by PCR: NEGATIVE
Influenza B by PCR: NEGATIVE
SARS Coronavirus 2 by RT PCR: POSITIVE — AB

## 2020-09-18 MED ORDER — IPRATROPIUM BROMIDE 0.03 % NA SOLN: 2.0000 | Freq: Two times a day (BID) | NASAL | 0 refills | Status: DC

## 2020-09-18 MED ORDER — ACETAMINOPHEN 500 MG PO TABS
500.0000 mg | ORAL_TABLET | Freq: Four times a day (QID) | ORAL | 0 refills | Status: DC | PRN
Start: 2020-09-18 — End: 2021-07-26

## 2020-09-18 NOTE — Discharge Instructions (Addendum)
You have been tested positive for COVID-19.  Please follow instruction provided below.  I also represcribed your nasal spray for your sinus congestion.  Recommendations for at home COVID-19 symptoms management:  Please continue isolation at home. Call [phone] to see whether you might be eligible for therapeutic antibody infusions (leave your name and they will call you back).  If have acute worsening of symptoms please go to ER/urgent care for further evaluation. Check pulse oximetry and if below 90-92% please go to ER. The following supplements MAY help:  Vitamin C 500mg  twice a day and Quercetin 250-500 mg twice a day Vitamin D3 2000 - 4000 u/day B Complex vitamins Zinc 75-100 mg/day Melatonin 6-10 mg at night (the optimal dose is unknown) Aspirin 81mg /day (if no history of bleeding issues)

## 2020-09-18 NOTE — ED Provider Notes (Signed)
[facility] EMERGENCY DEPARTMENT Provider Note   CSN: [id number] Arrival date & time: 09/18/20  1204     History Chief Complaint  Patient presents with  . Cough    Ronald Richardson is a 50 y.o. male.  The history is provided by the patient and medical records. No language interpreter was used.  Cough    50 year old male who is non-smoker with no history of alcohol abuse presenting with complaints of cold symptoms.  Patient reports since yesterday she has had sinus congestion, throat irritation, nonproductive cough, and some headaches.  Does not complain of fever chills no loss of taste or smell no shortness of breath and no abdominal pain.  He has been fully vaccinated for COVID-19.  He denies any recent sick contact.  He did try some nasal spray without adequate relief.  Past Medical History:  Diagnosis Date  . Allergy     Patient Active Problem List   Diagnosis Date Noted  . Allergic rhinitis due to allergen 07/31/2017    Past Surgical History:  Procedure Laterality Date  . NO PAST SURGERIES         Family History  Problem Relation Age of Onset  . Hypertension Neg Hx   . Diabetes Neg Hx     Social History   Tobacco Use  . Smoking status: Never Smoker  . Smokeless tobacco: Never Used  Vaping Use  . Vaping Use: Never used  Substance Use Topics  . Alcohol use: No    Alcohol/week: 0.0 standard drinks  . Drug use: No    Home Medications Prior to Admission medications   Medication Sig Start Date End Date Taking? Authorizing Provider  azelastine (ASTELIN) 0.1 % nasal spray Place 2 sprays into both nostrils 2 (two) times daily. Use in each nostril as directed 03/10/20   Claiborne Rigg, NP  ipratropium (ATROVENT) 0.03 % nasal spray Place 2 sprays into both nostrils 2 (two) times daily. 11/10/19 12/10/19  Claiborne Rigg, NP  loratadine (CLARITIN) 10 MG tablet Take 1 tablet (10 mg total) by mouth daily. 03/10/20   Claiborne Rigg, NP  terbinafine  (LAMISIL) 250 MG tablet Take 1 tablet (250 mg total) by mouth daily. 03/30/20   Claiborne Rigg, NP    Allergies    Flonase [fluticasone propionate]  Review of Systems   Review of Systems  Respiratory: Positive for cough.   All other systems reviewed and are negative.   Physical Exam Updated Vital Signs BP 127/88   Pulse (!) 112   Temp 98.8 F (37.1 C) (Oral)   Resp 18   Ht 5\' 11"  (1.803 m)   Wt 86.2 kg   SpO2 100%   BMI 26.50 kg/m   Physical Exam Vitals and nursing note reviewed.  Constitutional:      General: He is not in acute distress.    Appearance: He is well-developed and well-nourished.  HENT:     Head: Atraumatic.  Eyes:     Conjunctiva/sclera: Conjunctivae normal.  Cardiovascular:     Rate and Rhythm: Normal rate and regular rhythm.     Pulses: Normal pulses.     Heart sounds: Normal heart sounds.  Pulmonary:     Effort: Pulmonary effort is normal.     Breath sounds: Normal breath sounds. No wheezing, rhonchi or rales.  Abdominal:     Palpations: Abdomen is soft.     Tenderness: There is no abdominal tenderness.  Musculoskeletal:     Cervical back:  Neck supple.  Skin:    Findings: No rash.  Neurological:     Mental Status: He is alert.  Psychiatric:        Mood and Affect: Mood and affect and mood normal.     ED Results / Procedures / Treatments   Labs (all labs ordered are listed, but only abnormal results are displayed) Labs Reviewed  RESP PANEL BY RT-PCR (FLU A&B, COVID) ARPGX2 - Abnormal; Notable for the following components:      Result Value   SARS Coronavirus 2 by RT PCR POSITIVE (*)    All other components within normal limits    EKG None  Radiology No results found.  Procedures Procedures (including critical care time)  Medications Ordered in ED Medications - No data to display  ED Course  I have reviewed the triage vital signs and the nursing notes.  Pertinent labs & imaging results that were available during my care  of the patient were reviewed by me and considered in my medical decision making (see chart for details).    MDM Rules/Calculators/A&P                          BP 127/88   Pulse (!) 112   Temp 98.8 F (37.1 C) (Oral)   Resp 18   Ht 5\' 11"  (1.803 m)   Wt 86.2 kg   SpO2 100%   BMI 26.50 kg/m   Final Clinical Impression(s) / ED Diagnoses Final diagnoses:  COVID-19 virus infection    Rx / DC Orders ED Discharge Orders         Ordered    ipratropium (ATROVENT) 0.03 % nasal spray  2 times daily        09/18/20 1556    acetaminophen (TYLENOL) 500 MG tablet  Every 6 hours PRN        09/18/20 1556         3:57 PM Patient developed cold symptoms for the past 2 days.  He has been fully vaccinated for COVID-19.  His Covid test unfortunately is positive.  He is well-appearing no hypoxia and is stable for discharge.  Will provide symptomatic treatment.  He also has history of sinus allergies and requesting for nasal spray.  Ipratropium bromide nasal spray prescribed.  Return precaution discussed  Ronald Richardson was evaluated in Emergency Department on 09/18/2020 for the symptoms described in the history of present illness. He was evaluated in the context of the global COVID-19 pandemic, which necessitated consideration that the patient might be at risk for infection with the SARS-CoV-2 virus that causes COVID-19. Institutional protocols and algorithms that pertain to the evaluation of patients at risk for COVID-19 are in a state of rapid change based on information released by regulatory bodies including the CDC and federal and state organizations. These policies and algorithms were followed during the patient's care in the ED.    11/16/2020, PA-C 09/18/20 1602    11/16/20, MD 09/19/20 2046

## 2020-09-18 NOTE — ED Triage Notes (Signed)
Pt reports cough since yesterday and now has sore throat. Pt worried about a sinus infection. Denies any medical hx.

## 2020-09-18 NOTE — ED Notes (Signed)
Pt moved to COVID + waiting area.

## 2020-09-20 MED FILL — IPRATROPIUM 0.03% SPRAY: 0.03 | 30 days supply | Qty: 30 | Fill #0

## 2020-10-06 ENCOUNTER — Telehealth: Payer: Self-pay | Admitting: Nurse Practitioner

## 2020-10-06 NOTE — Telephone Encounter (Signed)
Pt returning call. He is requesting to have a call back. Please advise.

## 2020-10-06 NOTE — Telephone Encounter (Signed)
Pt is calling and went to [facility] on 09-18-2020 and was dx with covid and was given medication to get OTC and pt would like to know if zelda could call in OTC med so that he can use his flex spending card. Pt would like rxs for vit c 500 mg and vit d3 2000 mg. Pt said they did not written down how many pills to take per day.  Please sent to [facility]

## 2020-10-06 NOTE — Telephone Encounter (Signed)
Encouraged patient to f/u with pharmacy to see if the medication is there:  Vitamin C 500mg  twice a day and Quercetin 250-500 mg twice a day Vitamin D3 2000 - 4000 u/day B Complex vitamins Zinc 75-100 mg/day Melatonin 6-10 mg at night (the optimal dose is unknown) Aspirin 81mg /day (if no history of bleeding issues)

## 2021-02-10 ENCOUNTER — Other Ambulatory Visit: Payer: Self-pay

## 2021-02-10 ENCOUNTER — Ambulatory Visit: Payer: PRIVATE HEALTH INSURANCE | Attending: Physician Assistant | Admitting: Physician Assistant

## 2021-02-10 DIAGNOSIS — J3089 Other allergic rhinitis: Secondary | ICD-10-CM | POA: Diagnosis not present

## 2021-02-10 MED ORDER — AZELASTINE HCL 0.1 % NA SOLN
2.0000 | Freq: Two times a day (BID) | NASAL | 12 refills | Status: DC
  Filled 2021-02-10: qty 30, 30d supply, fill #0

## 2021-02-10 NOTE — Progress Notes (Signed)
Virtual Visit via Telephone Note  I connected with Ronald Richardson on 02/10/21 at 10:50 AM EDT by telephone and verified that I am speaking with the correct person using two identifiers.  Location: Patient: home Provider: [facility] office   I discussed the limitations, risks, security and privacy concerns of performing an evaluation and management service by telephone and the availability of in person appointments. I also discussed with the patient that there may be a patient responsible charge related to this service. The patient expressed understanding and agreed to proceed.   History of Present Illness: Needs nasal spray RF.  No new issues or concerns.     Observations/Objective: NAD.  A&Ox3  Assessment and Plan: 1. Non-seasonal allergic rhinitis due to other allergic trigger - azelastine (ASTELIN) 0.1 % nasal spray; Place 2 sprays into both nostrils 2 (two) times daily. Use in each nostril as directed  Dispense: 30 mL; Refill: 12    Follow Up Instructions: See PCP for bloodwork/check up in 3 months   I discussed the assessment and treatment plan with the patient. The patient was provided an opportunity to ask questions and all were answered. The patient agreed with the plan and demonstrated an understanding of the instructions.   The patient was advised to call back or seek an in-person evaluation if the symptoms worsen or if the condition fails to improve as anticipated.  I provided 6 minutes of non-face-to-face time during this encounter.   Georgian Co, PA-C  Patient ID: Ronald Richardson, male   DOB: [birth year], 50 y.o.   MRN: [id number]

## 2021-02-11 ENCOUNTER — Other Ambulatory Visit: Payer: Self-pay

## 2021-03-01 ENCOUNTER — Telehealth: Payer: Self-pay | Admitting: Nurse Practitioner

## 2021-03-01 NOTE — Telephone Encounter (Signed)
Called pt to schedule 3 Month F.U. w/ PCP, NP Bertram Denver for August.  LVM to call [facility] to schedule this appt.

## 2021-03-01 NOTE — Telephone Encounter (Signed)
-----   Message from Royce Macadamia, RN sent at 02/11/2021  5:29 PM EDT ----- Please se below message and schedule appt. Thank you

## 2021-07-14 ENCOUNTER — Encounter: Payer: Self-pay | Admitting: Physician Assistant

## 2021-07-14 ENCOUNTER — Ambulatory Visit: Payer: PRIVATE HEALTH INSURANCE | Attending: Physician Assistant | Admitting: Physician Assistant

## 2021-07-14 ENCOUNTER — Other Ambulatory Visit: Payer: Self-pay

## 2021-07-14 VITALS — BP 128/89 | HR 84 | Ht 71.0 in | Wt 186.1 lb

## 2021-07-14 DIAGNOSIS — J3089 Other allergic rhinitis: Secondary | ICD-10-CM

## 2021-07-14 DIAGNOSIS — B070 Plantar wart: Secondary | ICD-10-CM

## 2021-07-14 MED ORDER — IPRATROPIUM BROMIDE 0.03 % NA SOLN
NASAL | 3 refills | Status: DC
  Filled 2021-07-14: qty 30, 30d supply, fill #0

## 2021-07-14 NOTE — Patient Instructions (Signed)
Plantar Warts ?Plantar warts are small growths on the bottom of the foot (sole). Warts are caused by a type of germ (virus). Most warts are not painful, and they usually do not cause problems. Sometimes, plantar warts can cause pain when you walk. ?Warts often go away on their own in time. They can also spread to other areas of the body. Treatments may be done if needed. ?What are the causes? ?Plantar warts are caused by a germ that is called human papillomavirus (HPV). ?Walking barefoot can cause exposure to the germ, especially if your feet are wet. ?Warts happen when HPV attacks a break in the skin of the foot. ?What increases the risk? ?Being between 10 and 20 years of age. ?Using public showers or locker rooms. ?Having a weakened body defense system (immune system). ?What are the signs or symptoms? ? ?Flat or slightly raised growths that have a rough surface and look like a callus. ?Pain when you use your foot to support your body weight. ?How is this treated? ?In many cases, warts do not need treatment. Without treatment, they often go away with time. If treatment is needed or wanted, options may include: ?Applying medicated solutions, creams, or patches to the wart. These make the skin soft so that layers will slowly shed away. ?Freezing the wart with liquid nitrogen (cryotherapy). ?Burning the wart with: ?Laser treatment. ?An electrified probe (electrocautery). ?Injecting a medicine (Candida antigen) into the wart to help the body's defense system fight off the wart. ?Having surgery to remove the wart. ?Putting duct tape over the top of the wart (occlusion). You will leave the tape in place for as long as told by your doctor. Then you will replace it with a new strip of tape. This is done until the wart goes away. ?Repeat treatment may be needed if you choose to remove warts. Warts sometimes go away and come back again. ?Follow these instructions at home: ?General instructions ?Apply creams or solutions only  as told by your doctor. Follow these steps if your doctor tells you to do so: ?Soak your foot in warm water. ?Remove the top layer of softened skin before you apply the medicine. You can use a pumice stone to remove the skin. ?After you apply the medicine, put a bandage over the area of the wart. ?Repeat the process every day or as told by your doctor. ?Do not scratch or pick at a wart. ?Wash your hands after you touch a wart. ?If a wart hurts, try covering it with a bandage that has a hole in the middle. ?Keep all follow-up visits as told by your doctor. This is important. ?How is this prevented? ? ?Wear shoes and socks. Change your socks every day. ?Keep your feet clean and dry. ?Check your feet often. ?Do not walk barefoot in: ?Shared locker rooms. ?Shower areas. ?Swimming pools. ?Avoid direct contact with warts on other people. ?Contact a doctor if: ?Your warts do not improve after treatment. ?You have redness, swelling, or pain at the site of a wart. ?You have bleeding from a wart, and the bleeding does not stop when you put light pressure on the wart. ?You have diabetes and you get a wart. ?Summary ?Warts are small growths on the skin. ?When warts happen on the bottom of the foot (sole), they are called plantar warts. ?In many cases, warts do not need treatment. ?Apply creams or solutions only as told by your doctor. ?Do not scratch or pick at a wart. Wash your hands   after you touch a wart. ?This information is not intended to replace advice given to you by your health care provider. Make sure you discuss any questions you have with your health care provider. ?Document Revised: 12/23/2020 Document Reviewed: 12/23/2020 ?Elsevier Patient Education ? 2022 Elsevier Inc. ? ?

## 2021-07-14 NOTE — Progress Notes (Signed)
Ronald Richardson, is a 50 y.o. male  WCB:[id number]  OHY:[id number]  DOB - 1971-05-06  Chief Complaint  Patient presents with   Foot Pain   Arm Pain    Left arm pain x 3 months       Subjective:   Ronald Richardson is a 50 y.o. male here today for tender area on medial aspect of R foot.  It has been there for several months and getting bigger.    ALLERGIES: Allergies  Allergen Reactions   Flonase [Fluticasone Propionate] Other (See Comments)    HEADACHES    PAST MEDICAL HISTORY: Past Medical History:  Diagnosis Date   Allergy     MEDICATIONS AT HOME: Prior to Admission medications   Medication Sig Start Date End Date Taking? Authorizing Provider  acetaminophen (TYLENOL) 500 MG tablet Take 1 tablet (500 mg total) by mouth every 6 (six) hours as needed. Patient not taking: Reported on 07/14/2021 09/18/20   Fayrene Helper, PA-C  ipratropium (ATROVENT) 0.03 % nasal spray PLACE 2 SPRAYS INTO BOTH NOSTRILS 2 (TWO) TIMES DAILY. 07/14/21 07/14/22  Anders Simmonds, PA-C  loratadine (CLARITIN) 10 MG tablet Take 1 tablet (10 mg total) by mouth daily. Patient not taking: Reported on 07/14/2021 03/10/20   Claiborne Rigg, NP  terbinafine (LAMISIL) 250 MG tablet Take 1 tablet (250 mg total) by mouth daily. Patient not taking: Reported on 07/14/2021 03/30/20   Claiborne Rigg, NP    ROS:   Objective:   Vitals:   07/14/21 1511  BP: 128/89  Pulse: 84  SpO2: 96%  Weight: 186 lb 2 oz (84.4 kg)  Height: 5\' 11"  (1.803 m)   Exam General appearance : Awake, alert, not in any distress. Speech Clear. Not toxic looking HEENT: Atraumatic and Normocephalic Neck: Supple, no JVD. No cervical lymphadenopathy.  Chest: Good air entry bilaterally, CTAB.  No rales/rhonchi/wheezing CVS: S1 S2 regular, no murmurs.  R foot medial aspect of foot at the junction of the plantar and dorsal area, there is a plantars wart Extremities: B/L Lower Ext shows no edema, both legs are warm to touch Neurology: Awake  alert, and oriented X 3, CN II-XII intact, Non focal Skin: No Rash  Data Review Lab Results  Component Value Date   HGBA1C 5.5 03/15/2020   HGBA1C 5.8 (H) 11/12/2019    Assessment & Plan   1. Plantar wart of right foot - Ambulatory referral to Podiatry  2. Non-seasonal allergic rhinitis due to other allergic trigger - ipratropium (ATROVENT) 0.03 % nasal spray; PLACE 2 SPRAYS INTO BOTH NOSTRILS 2 (TWO) TIMES DAILY.  Dispense: 30 mL; Refill: 3    Patient have been counseled extensively about nutrition and exercise. Other issues discussed during this visit include: low cholesterol diet, weight control and daily exercise, foot care, annual eye examinations at Ophthalmology, importance of adherence with medications and regular follow-up. We also discussed long term complications of uncontrolled diabetes and hypertension.   Return if symptoms worsen or fail to improve.  The patient was given clear instructions to go to ER or return to medical center if symptoms don't improve, worsen or new problems develop. The patient verbalized understanding. The patient was told to call to get lab results if they haven't heard anything in the next week.      11/14/2019, PA-C [facility] [place], [place]   07/14/2021, 4:06 PM Patient ID: Ronald Richardson, male   DOB: November 13, 1970, 50 y.o.   MRN: [id number]

## 2021-07-18 ENCOUNTER — Other Ambulatory Visit: Payer: Self-pay

## 2021-07-25 ENCOUNTER — Ambulatory Visit: Payer: Self-pay

## 2021-07-25 NOTE — Telephone Encounter (Signed)
Please schedule him for televisit for 07-26-2021. Thanks!

## 2021-07-25 NOTE — Telephone Encounter (Signed)
  Pt. Reports he started having headaches 1 month ago. Hurts on the left side. Has had some "runny nose" recently. Has not taken anything for pain. Instructed him that Tylenol is on his medication list. Verbalizes understanding. Pain 3-4/10. Wants to know from PCP what else he should do. Please advise.   Answer Assessment - Initial Assessment Questions 1. LOCATION: "Where does it hurt?"      Left side 2. ONSET: "When did the headache start?" (Minutes, hours or days)      1 month ago 3. PATTERN: "Does the pain come and go, or has it been constant since it started?"     Comes and goes 4. SEVERITY: "How bad is the pain?" and "What does it keep you from doing?"  (e.g., Scale 1-10; mild, moderate, or severe)   - MILD (1-3): doesn't interfere with normal activities    - MODERATE (4-7): interferes with normal activities or awakens from sleep    - SEVERE (8-10): excruciating pain, unable to do any normal activities        Sometimes - 3-4 5. RECURRENT SYMPTOM: "Have you ever had headaches before?" If Yes, ask: "When was the last time?" and "What happened that time?"      Yes 6. CAUSE: "What do you think is causing the headache?"     Unsure 7. MIGRAINE: "Have you been diagnosed with migraine headaches?" If Yes, ask: "Is this headache similar?"      No 8. HEAD INJURY: "Has there been any recent injury to the head?"      No 9. OTHER SYMPTOMS: "Do you have any other symptoms?" (fever, stiff neck, eye pain, sore throat, cold symptoms)     No 10. PREGNANCY: "Is there any chance you are pregnant?" "When was your last menstrual period?"       N/a  Protocols used: Headache-A-AH

## 2021-07-25 NOTE — Telephone Encounter (Signed)
Please schedule him for televisit for today/Tuesday 07-26-2021. Thanks!

## 2021-07-26 ENCOUNTER — Other Ambulatory Visit: Payer: Self-pay

## 2021-07-26 ENCOUNTER — Encounter: Payer: Self-pay | Admitting: Nurse Practitioner

## 2021-07-26 ENCOUNTER — Ambulatory Visit: Payer: PRIVATE HEALTH INSURANCE | Attending: Nurse Practitioner | Admitting: Nurse Practitioner

## 2021-07-26 DIAGNOSIS — Z1211 Encounter for screening for malignant neoplasm of colon: Secondary | ICD-10-CM

## 2021-07-26 DIAGNOSIS — R7989 Other specified abnormal findings of blood chemistry: Secondary | ICD-10-CM

## 2021-07-26 DIAGNOSIS — R7303 Prediabetes: Secondary | ICD-10-CM | POA: Diagnosis not present

## 2021-07-26 DIAGNOSIS — G44229 Chronic tension-type headache, not intractable: Secondary | ICD-10-CM

## 2021-07-26 DIAGNOSIS — J3089 Other allergic rhinitis: Secondary | ICD-10-CM

## 2021-07-26 MED ORDER — LORATADINE 10 MG PO TABS
10.0000 mg | ORAL_TABLET | Freq: Every day | ORAL | 11 refills | Status: AC
  Filled 2021-07-26: qty 30, 30d supply, fill #0

## 2021-07-26 MED ORDER — SUMATRIPTAN SUCCINATE 50 MG PO TABS
50.0000 mg | ORAL_TABLET | Freq: Once | ORAL | 1 refills | Status: AC
  Filled 2021-07-26: qty 9, 30d supply, fill #0

## 2021-07-26 MED ORDER — IPRATROPIUM BROMIDE 0.03 % NA SOLN
NASAL | 3 refills | Status: AC
  Filled 2021-07-26: qty 30, fill #0

## 2021-07-26 NOTE — Telephone Encounter (Signed)
Done. Left pt VM of scheduled appt.

## 2021-07-26 NOTE — Progress Notes (Signed)
Virtual Visit via Telephone Note Due to national recommendations of social distancing due to COVID 19, telehealth visit is felt to be most appropriate for this patient at this time.  I discussed the limitations, risks, security and privacy concerns of performing an evaluation and management service by telephone and the availability of in person appointments. I also discussed with the patient that there may be a patient responsible charge related to this service. The patient expressed understanding and agreed to proceed.    I connected with Ronald Richardson on 07/26/21  at   2:50 PM EST  EDT by telephone and verified that I am speaking with the correct person using two identifiers.  Location of Patient: Private Residence   Location of Provider: [facility]    Persons participating in Telemedicine visit: Zelda Fleming FNP-BC Lamount Tesfaye    History of Present Illness: Telemedicine visit for: Headaches  Onset over a month ago. Can last up to 2 days and resolve on their own. Headaches are occurring on the left side of the temple. He has not taken any medications for his headaches. Pain 8/10. Denies any weakness, numbness or tingling in arm or leg or facial numbness/droop.     Past Medical History:  Diagnosis Date   Allergy     Past Surgical History:  Procedure Laterality Date   NO PAST SURGERIES      Family History  Problem Relation Age of Onset   Hypertension Neg Hx    Diabetes Neg Hx     Social History   Socioeconomic History   Marital status: Married    Spouse name: Not on file   Number of children: Not on file   Years of education: Not on file   Highest education level: Not on file  Occupational History   Not on file  Tobacco Use   Smoking status: Never   Smokeless tobacco: Never  Vaping Use   Vaping Use: Never used  Substance and Sexual Activity   Alcohol use: No    Alcohol/week: 0.0 standard drinks   Drug use: No   Sexual activity:  Yes  Other Topics Concern   Not on file  Social History Narrative   Not on file   Social Determinants of Health   Financial Resource Strain: Not on file  Food Insecurity: Not on file  Transportation Needs: Not on file  Physical Activity: Not on file  Stress: Not on file  Social Connections: Not on file     Observations/Objective: Awake, alert and oriented x 3   Review of Systems  Constitutional:  Negative for fever, malaise/fatigue and weight loss.  HENT: Negative.  Negative for nosebleeds.   Eyes: Negative.  Negative for blurred vision, double vision and photophobia.  Respiratory: Negative.  Negative for cough and shortness of breath.   Cardiovascular: Negative.  Negative for chest pain, palpitations and leg swelling.  Gastrointestinal: Negative.  Negative for heartburn, nausea and vomiting.  Musculoskeletal: Negative.  Negative for myalgias.  Neurological:  Positive for headaches. Negative for dizziness, focal weakness and seizures.  Endo/Heme/Allergies:  Positive for environmental allergies.  Psychiatric/Behavioral: Negative.  Negative for suicidal ideas.    Assessment and Plan: Diagnoses and all orders for this visit:  Chronic tension-type headache, not intractable -     SUMAtriptan (IMITREX) 50 MG tablet; Take 1 tablet (50 mg total) by mouth once for 1 dose. May repeat in 2 hours if headache persists or recurs. -     Hemoglobin A1c; Future -       CMP14+EGFR; Future -     CBC; Future -     loratadine (CLARITIN) 10 MG tablet; Take 1 tablet (10 mg total) by mouth daily. -     ipratropium (ATROVENT) 0.03 % nasal spray; PLACE 2 SPRAYS INTO BOTH NOSTRILS 2 (TWO) TIMES DAILY.  Prediabetes -     Hemoglobin A1c; Future -     CMP14+EGFR; Future -     Lipid panel; Future  Colon cancer screening -     Ambulatory referral to Gastroenterology  Abnormal CBC -     CBC; Future  Non-seasonal allergic rhinitis due to other allergic trigger -     loratadine (CLARITIN) 10 MG  tablet; Take 1 tablet (10 mg total) by mouth daily. -     ipratropium (ATROVENT) 0.03 % nasal spray; PLACE 2 SPRAYS INTO BOTH NOSTRILS 2 (TWO) TIMES DAILY.    Follow Up Instructions Return in about 3 weeks (around 08/16/2021) for tele headaches..     I discussed the assessment and treatment plan with the patient. The patient was provided an opportunity to ask questions and all were answered. The patient agreed with the plan and demonstrated an understanding of the instructions.   The patient was advised to call back or seek an in-person evaluation if the symptoms worsen or if the condition fails to improve as anticipated.  I provided 10 minutes of non-face-to-face time during this encounter including median intraservice time, reviewing previous notes, labs, imaging, medications and explaining diagnosis and management.  Zelda W Fleming, FNP-BC  

## 2021-07-27 ENCOUNTER — Other Ambulatory Visit: Payer: Self-pay

## 2021-08-16 ENCOUNTER — Ambulatory Visit: Payer: PRIVATE HEALTH INSURANCE | Attending: Nurse Practitioner | Admitting: Nurse Practitioner

## 2021-08-16 ENCOUNTER — Other Ambulatory Visit: Payer: Self-pay

## 2021-10-13 ENCOUNTER — Encounter: Payer: Self-pay | Admitting: Nurse Practitioner
